# Patient Record
Sex: Female | Born: 2000 | Race: Black or African American | Hispanic: No | Marital: Single | Smoking: Never smoker
Health system: Southern US, Community
[De-identification: ages and names within clinical notes are randomized; demographics above are authoritative.]

## PROBLEM LIST (undated history)

## (undated) DIAGNOSIS — L309 Dermatitis, unspecified: Secondary | ICD-10-CM

## (undated) DIAGNOSIS — J45909 Unspecified asthma, uncomplicated: Secondary | ICD-10-CM

## (undated) HISTORY — DX: Dermatitis, unspecified: L30.9

---
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
## 2018-07-12 ENCOUNTER — Telehealth: Payer: Self-pay | Admitting: *Deleted

## 2018-07-12 ENCOUNTER — Other Ambulatory Visit: Payer: Self-pay | Admitting: *Deleted

## 2018-07-12 MED ORDER — TRIAMCINOLONE ACETONIDE 0.1 % EX OINT: 1.0000 "application " | TOPICAL_OINTMENT | Freq: Two times a day (BID) | CUTANEOUS | 0 refills | Status: AC

## 2018-07-12 NOTE — Telephone Encounter (Signed)
Received call from patient.   Requested refill on Triamcinolone.   Advised that last OV noted in 2018. Advised no refills can be given without OV <1 year.   States that she has been caring for mother who had brain aneurysm and CVA. States that she will schedule appt, but would like to wait a little while D/T COVID 19.  Agreeable to terms. Prescription sent to pharmacy.

## 2019-04-29 ENCOUNTER — Other Ambulatory Visit: Payer: Self-pay

## 2019-04-29 ENCOUNTER — Encounter (HOSPITAL_COMMUNITY): Payer: Self-pay | Admitting: Emergency Medicine

## 2019-04-29 ENCOUNTER — Emergency Department (HOSPITAL_COMMUNITY)
Admission: EM | Admit: 2019-04-29 | Discharge: 2019-04-30 | Disposition: A | Discharge status: Home / Self Care | Payer: Medicaid Other | Attending: Emergency Medicine | Admitting: Emergency Medicine

## 2019-04-29 DIAGNOSIS — Z5321 Procedure and treatment not carried out due to patient leaving prior to being seen by health care provider: Secondary | ICD-10-CM | POA: Diagnosis not present

## 2019-04-29 DIAGNOSIS — R1012 Left upper quadrant pain: Secondary | ICD-10-CM | POA: Diagnosis present

## 2019-04-29 MED ORDER — SODIUM CHLORIDE 0.9% FLUSH: 3.0000 mL | Freq: Once | INTRAVENOUS | Status: DC

## 2019-04-29 NOTE — ED Triage Notes (Signed)
Patient reports LUQ pain onset today , denies emesis or diarrhea , no fever or chills .

## 2019-04-30 LAB — COMPREHENSIVE METABOLIC PANEL
ALT: 14 U/L (ref 0–44)
AST: 17 U/L (ref 15–41)
Albumin: 4.4 g/dL (ref 3.5–5.0)
Alkaline Phosphatase: 57 U/L (ref 38–126)
Anion gap: 9 (ref 5–15)
BUN: 12 mg/dL (ref 6–20)
CO2: 24 mmol/L (ref 22–32)
Calcium: 9.6 mg/dL (ref 8.9–10.3)
Chloride: 107 mmol/L (ref 98–111)
Creatinine, Ser: 0.98 mg/dL (ref 0.44–1.00)
GFR calc Af Amer: >60 mL/min (ref >60)
GFR calc non Af Amer: >60 mL/min (ref >60)
Glucose, Bld: 84 mg/dL (ref 70–99)
Potassium: 3.9 mmol/L (ref 3.5–5.1)
Sodium: 140 mmol/L (ref 135–145)
Total Bilirubin: 0.1 mg/dL — ABNORMAL LOW (ref 0.3–1.2)
Total Protein: 7.4 g/dL (ref 6.5–8.1)

## 2019-04-30 LAB — CBC
HCT: 42 % (ref 36.0–46.0)
Hemoglobin: 13.7 g/dL (ref 12.0–15.0)
MCH: 30.2 pg (ref 26.0–34.0)
MCHC: 32.6 g/dL (ref 30.0–36.0)
MCV: 92.7 fL (ref 80.0–100.0)
Platelets: 339 10*3/uL (ref 150–400)
RBC: 4.53 MIL/uL (ref 3.87–5.11)
RDW: 12.2 % (ref 11.5–15.5)
WBC: 6.8 10*3/uL (ref 4.0–10.5)
nRBC: 0 % (ref 0.0–0.2)

## 2019-04-30 LAB — URINALYSIS, ROUTINE W REFLEX MICROSCOPIC
Bilirubin Urine: NEGATIVE
Glucose, UA: NEGATIVE mg/dL
Hgb urine dipstick: NEGATIVE
Ketones, ur: 5 mg/dL — AB
Leukocytes,Ua: NEGATIVE
Nitrite: NEGATIVE
Protein, ur: NEGATIVE mg/dL
Specific Gravity, Urine: 1.024 (ref 1.005–1.030)
pH: 6 (ref 5.0–8.0)

## 2019-04-30 LAB — I-STAT BETA HCG BLOOD, ED (MC, WL, AP ONLY): I-stat hCG, quantitative: <5 m[IU]/mL (ref <5)

## 2019-04-30 LAB — LIPASE, BLOOD: Lipase: 19 U/L (ref 11–51)

## 2019-04-30 NOTE — ED Notes (Signed)
Pt stated she does not want to wait because of how people in the waiting room are acting.

## 2019-09-12 ENCOUNTER — Ambulatory Visit: Payer: Self-pay | Admitting: Nurse Practitioner

## 2019-09-14 ENCOUNTER — Ambulatory Visit: Payer: Self-pay | Admitting: Nurse Practitioner

## 2019-11-27 ENCOUNTER — Ambulatory Visit (HOSPITAL_COMMUNITY)
Admission: EM | Admit: 2019-11-27 | Discharge: 2019-11-27 | Disposition: A | Discharge status: Home / Self Care | Payer: Medicaid Other | Attending: Physician Assistant | Admitting: Physician Assistant

## 2019-11-27 ENCOUNTER — Encounter (HOSPITAL_COMMUNITY): Payer: Self-pay

## 2019-11-27 ENCOUNTER — Other Ambulatory Visit: Payer: Self-pay

## 2019-11-27 DIAGNOSIS — L2082 Flexural eczema: Secondary | ICD-10-CM

## 2019-11-27 LAB — POC URINE PREG, ED: Preg Test, Ur: NEGATIVE

## 2019-11-27 MED ORDER — PREDNISONE 10 MG PO TABS: 40.0000 mg | ORAL_TABLET | Freq: Every day | ORAL | 0 refills | Status: AC

## 2019-11-27 MED ORDER — TRIAMCINOLONE 0.1 % CREAM:EUCERIN CREAM 1:1: 1.0000 "application " | TOPICAL_CREAM | Freq: Three times a day (TID) | CUTANEOUS | 0 refills | Status: AC | PRN

## 2019-11-27 MED ORDER — CETIRIZINE HCL 10 MG PO TABS: 10.0000 mg | ORAL_TABLET | Freq: Every day | ORAL | 0 refills | Status: AC

## 2019-11-27 MED ORDER — ALBUTEROL SULFATE HFA 108 (90 BASE) MCG/ACT IN AERS: 1.0000 | INHALATION_SPRAY | Freq: Four times a day (QID) | RESPIRATORY_TRACT | 0 refills | Status: AC | PRN

## 2019-11-27 MED ORDER — CLOBETASOL PROPIONATE 0.05 % EX OINT: 1.0000 "application " | TOPICAL_OINTMENT | Freq: Two times a day (BID) | CUTANEOUS | 0 refills | Status: AC

## 2019-11-27 NOTE — ED Triage Notes (Signed)
Pt has recurrent flair of eczema started 2 wks ago. Pt states it's draining clear fluids and it's open. Pt has it on inside of thighs and AC areas bilat. Pt states when she walks it's painful due to skin rubbing with eczema.

## 2019-11-27 NOTE — ED Provider Notes (Signed)
[facility]    CSN: [id number] Arrival date & time: 11/27/19  1248      History   Chief Complaint Chief Complaint  Patient presents with  . Eczema    HPI Emma Kerr is a 19 y.o. female.   Patient with history of eczema presents for 2-week history of eczema flare.  She reports she has a large eczema flare on both inner thighs has been causing a lot of irritation and pain due to friction.  She also reports flares on both inner elbows.  She reports occasionally she will get flares behind her knees but this is not actively flaring.  She was previously followed by her primary care however she has not seen this provider in a while as she moved to the area and recently moved back.  She has been using Eucerin to manage her eczema on her own but this has not helped this most recent flare.  Patient is also requesting refill on her albuterol inhaler.  Denies shortness of breath or wheezing.  She reports she is at night.  Has not had to use it recently.     Past Medical History:  Diagnosis Date  . Asthma   . Eczema     Patient Active Problem List   Diagnosis Date Noted  . Contraceptive management 04/16/2016  . Left knee pain 08/24/2013  . Idiopathic scoliosis 08/24/2013  . Decreased visual acuity 12/14/2012  . Eczema 12/14/2012  . Asthma, mild intermittent 12/14/2012  . Back pain 12/14/2012    History reviewed. No pertinent surgical history.  OB History   No obstetric history on file.      Home Medications    Prior to Admission medications   Medication Sig Start Date End Date Taking? Authorizing Provider  albuterol (PROAIR HFA) 108 (90 Base) MCG/ACT inhaler Inhale 1-2 puffs into the lungs every 6 (six) hours as needed for wheezing or shortness of breath. INHALE 2 PUFFS BY MOUTH EVERY 4 HOURS AS NEEDED FOR SHORTNESS OF BREATH 11/27/19   Hellena Pridgen, Veryl Speak, PA-C  cetirizine (ZYRTEC ALLERGY) 10 MG tablet Take 1 tablet (10 mg total) by mouth daily. 11/27/19   Cyan Clippinger, Veryl Speak, PA-C  clobetasol ointment (TEMOVATE) 0.05 % Apply 1 application topically 2 (two) times daily for 7 days. 11/27/19 12/04/19  Staria Birkhead, Veryl Speak, PA-C  fluticasone (FLOVENT HFA) 44 MCG/ACT inhaler INHALE 1 PUFF INTO THE LUNGS TWICE DAILY 07/02/16   Salley Scarlet, MD  ibuprofen (ADVIL,MOTRIN) 600 MG tablet Take 1 tablet (600 mg total) by mouth every 8 (eight) hours as needed. 04/16/16   Salley Scarlet, MD  loratadine (CLARITIN) 10 MG tablet Take 1 tablet (10 mg total) by mouth daily. 04/16/16   Esmeralda, Velna Hatchet, MD  predniSONE (DELTASONE) 10 MG tablet Take 4 tablets (40 mg total) by mouth daily for 7 days. 11/27/19 12/04/19  Deshara Rossi, Veryl Speak, PA-C  Triamcinolone Acetonide (TRIAMCINOLONE 0.1 % CREAM : EUCERIN) CREA Apply 1 application topically 3 (three) times daily as needed. 11/27/19   Kairon Shock, Veryl Speak, PA-C  triamcinolone ointment (KENALOG) 0.1 % Apply 1 application topically 2 (two) times daily. 07/12/18   Salley Scarlet, MD    Family History No family history on file.  Social History Social History   Tobacco Use  . Smoking status: Never Smoker  . Smokeless tobacco: Never Used  Substance Use Topics  . Alcohol use: No    Alcohol/week: 0.0 standard drinks  . Drug use: Yes  Types: Marijuana     Allergies   Patient has no known allergies.   Review of Systems Review of Systems   Physical Exam Triage Vital Signs ED Triage Vitals  Enc Vitals Group     BP 11/27/19 1314 (!) 121/51     Pulse Rate 11/27/19 1314 69     Resp 11/27/19 1314 16     Temp 11/27/19 1314 98.2 F (36.8 C)     Temp Source 11/27/19 1314 Oral     SpO2 11/27/19 1314 100 %     Weight 11/27/19 1315 130 lb (59 kg)     Height 11/27/19 1315 5\' 1"  (1.549 m)     Head Circumference --      Peak Flow --      Pain Score 11/27/19 1315 8     Pain Loc --      Pain Edu? --      Excl. in GC? --    No data found.  Updated Vital Signs BP (!) 121/51   Pulse 69   Temp 98.2 F (36.8 C) (Oral)   Resp 16   Ht 5\' 1"  (1.549  m)   Wt 130 lb (59 kg)   SpO2 100%   BMI 24.56 kg/m   Visual Acuity Right Eye Distance:   Left Eye Distance:   Bilateral Distance:    Right Eye Near:   Left Eye Near:    Bilateral Near:     Physical Exam Vitals and nursing note reviewed.  Constitutional:      General: She is not in acute distress.    Appearance: She is well-developed.  HENT:     Head: Normocephalic and atraumatic.  Cardiovascular:     Rate and Rhythm: Normal rate.  Pulmonary:     Effort: Pulmonary effort is normal. No respiratory distress.  Musculoskeletal:     Cervical back: Neck supple.  Skin:    General: Skin is warm and dry.     Comments: Large swath of eczematous skin on the inner thighs.  Scant clear discharge.  No significant erythema or purulent discharge.  Flexural eczema patches on the in the antecubitals bilaterally.  Fine papular eczematous lesions on the face.  No eye involvement  Pictured below are AC and inner thigh lesions  Neurological:     Mental Status: She is alert.                UC Treatments / Results  Labs (all labs ordered are listed, but only abnormal results are displayed) Labs Reviewed  POC URINE PREG, ED    EKG   Radiology No results found.  Procedures Procedures (including critical care time)  Medications Ordered in UC Medications - No data to display  Initial Impression / Assessment and Plan / UC Course  I have reviewed the triage vital signs and the nursing notes.  Pertinent labs & imaging results that were available during my care of the patient were reviewed by me and considered in my medical decision making (see chart for details).     Flexural eczema Patient is a 19 year old with history of eczema presenting with moderate to severe eczema flare.  Does not appear infected.  Will place on prednisone.  Short course of clobetasol, to be followed with triamcinolone Eucerin combination.  Zyrtec for itch.  Referral for dermatology sent.   Instructed patient to call family medicine center tomorrow to take the soonest available appointment to establish care with a primary care and discuss dermatology referral.  Return and follow-up precautions discussed.  Patient verbalized understanding Final Clinical Impressions(s) / UC Diagnoses   Final diagnoses:  Flexural eczema     Discharge Instructions     Take prednisone as prescribed Apply clobetasol ointment sparingly across areas, 2 times a day for 1 week  - then begin using combination lotion/triamcinolone  Take zyrtec daily  Apply aquaphor as you have - you may also use vaseline  Avoid skin to skin fricition  Call family medicine center tomorrow for follow up ASAP  Call the derm number tomorrow to verify referral      ED Prescriptions    Medication Sig Dispense Auth. Provider   predniSONE (DELTASONE) 10 MG tablet Take 4 tablets (40 mg total) by mouth daily for 7 days. 28 tablet Yaroslav Gombos, Veryl Speak, PA-C   clobetasol ointment (TEMOVATE) 0.05 % Apply 1 application topically 2 (two) times daily for 7 days. 30 g Makinze Jani, Veryl Speak, PA-C   Triamcinolone Acetonide (TRIAMCINOLONE 0.1 % CREAM : EUCERIN) CREA Apply 1 application topically 3 (three) times daily as needed. 1 each Omer Puccinelli, Veryl Speak, PA-C   cetirizine (ZYRTEC ALLERGY) 10 MG tablet Take 1 tablet (10 mg total) by mouth daily. 30 tablet Kyleigha Markert, Veryl Speak, PA-C   albuterol (PROAIR HFA) 108 (90 Base) MCG/ACT inhaler Inhale 1-2 puffs into the lungs every 6 (six) hours as needed for wheezing or shortness of breath. INHALE 2 PUFFS BY MOUTH EVERY 4 HOURS AS NEEDED FOR SHORTNESS OF BREATH 18 g Dionisia Pacholski, Veryl Speak, PA-C     PDMP not reviewed this encounter.   Hermelinda Medicus, PA-C 11/27/19 1423

## 2019-11-27 NOTE — Discharge Instructions (Signed)
Take prednisone as prescribed Apply clobetasol ointment sparingly across areas, 2 times a day for 1 week  - then begin using combination lotion/triamcinolone  Take zyrtec daily  Apply aquaphor as you have - you may also use vaseline  Avoid skin to skin fricition  Call family medicine center tomorrow for follow up ASAP  Call the derm number tomorrow to verify referral

## 2020-08-05 ENCOUNTER — Ambulatory Visit (HOSPITAL_COMMUNITY)
Admission: EM | Admit: 2020-08-05 | Discharge: 2020-08-05 | Disposition: A | Discharge status: Home / Self Care | Payer: Medicaid Other | Attending: Family Medicine | Admitting: Family Medicine

## 2020-08-05 ENCOUNTER — Encounter (HOSPITAL_COMMUNITY): Payer: Self-pay | Admitting: Emergency Medicine

## 2020-08-05 DIAGNOSIS — N6310 Unspecified lump in the right breast, unspecified quadrant: Secondary | ICD-10-CM

## 2020-08-05 DIAGNOSIS — M778 Other enthesopathies, not elsewhere classified: Secondary | ICD-10-CM

## 2020-08-05 MED ORDER — PREDNISONE 20 MG PO TABS: 40.0000 mg | ORAL_TABLET | Freq: Every day | ORAL | 0 refills | Status: AC

## 2020-08-05 MED ORDER — CYCLOBENZAPRINE HCL 10 MG PO TABS: 5.0000 mg | ORAL_TABLET | Freq: Three times a day (TID) | ORAL | 0 refills | Status: AC | PRN

## 2020-08-05 NOTE — ED Triage Notes (Signed)
Pt presents today with left elbow pain x 1 week; denies injury. She also c/o lump to right upper breast that she is concerned about.

## 2020-08-05 NOTE — Discharge Instructions (Addendum)
May try Kinesiotape to help with your elbow tendinitis

## 2020-08-05 NOTE — ED Provider Notes (Signed)
[facility]    CSN: [id number] Arrival date & time: 08/05/20  1113      History   Chief Complaint Chief Complaint  Patient presents with  . Elbow Pain    HPI Emma Kerr is a 20 y.o. female.   Today with 1 week history of left elbow pain.  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  The elbow moves without difficulty but is very painful to do so and she is experiencing some weakness to the left hand because of the pain.  Has not tried anything over-the-counter for symptoms thus far. She is also having an intermittent right breast lump that seems to come on during stressful times.  This is the second time she has had this lump.  It is not painful, red, draining.  No injury to the area.  Does not seem to be related to her menstrual cycles.  No nipple discharge.     Past Medical History:  Diagnosis Date  . Asthma   . Eczema     Patient Active Problem List   Diagnosis Date Noted  . Contraceptive management 04/16/2016  . Left knee pain 08/24/2013  . Idiopathic scoliosis 08/24/2013  . Decreased visual acuity 12/14/2012  . Eczema 12/14/2012  . Asthma, mild intermittent 12/14/2012  . Back pain 12/14/2012    History reviewed. No pertinent surgical history.  OB History   No obstetric history on file.      Home Medications    Prior to Admission medications   Medication Sig Start Date End Date Taking? Authorizing Provider  cyclobenzaprine (FLEXERIL) 10 MG tablet Take 0.5-1 tablets (5-10 mg total) by mouth 3 (three) times daily as needed for muscle spasms. Do not drink alcohol or drive while taking this medication 08/05/20  Yes Particia Nearing, PA-C  predniSONE (DELTASONE) 20 MG tablet Take 2 tablets (40 mg total) by mouth daily with breakfast. 08/05/20  Yes Particia Nearing, PA-C  albuterol [facility] HFA) 108 (90 Base) MCG/ACT inhaler Inhale 1-2 puffs into the lungs every 6 (six) hours as needed for  wheezing or shortness of breath. INHALE 2 PUFFS BY MOUTH EVERY 4 HOURS AS NEEDED FOR SHORTNESS OF BREATH 11/27/19   Darr, Gerilyn Pilgrim, PA-C  cetirizine (ZYRTEC ALLERGY) 10 MG tablet Take 1 tablet (10 mg total) by mouth daily. 11/27/19   Darr, Gerilyn Pilgrim, PA-C  fluticasone (FLOVENT HFA) 44 MCG/ACT inhaler INHALE 1 PUFF INTO THE LUNGS TWICE DAILY 07/02/16   Salley Scarlet, MD  ibuprofen (ADVIL,MOTRIN) 600 MG tablet Take 1 tablet (600 mg total) by mouth every 8 (eight) hours as needed. 04/16/16   Salley Scarlet, MD  loratadine (CLARITIN) 10 MG tablet Take 1 tablet (10 mg total) by mouth daily. 04/16/16   [place], Velna Hatchet, MD  Triamcinolone Acetonide (TRIAMCINOLONE 0.1 % CREAM : EUCERIN) CREA Apply 1 application topically 3 (three) times daily as needed. 11/27/19   Darr, Gerilyn Pilgrim, PA-C  triamcinolone ointment (KENALOG) 0.1 % Apply 1 application topically 2 (two) times daily. 07/12/18   Salley Scarlet, MD    Family History Family History  Problem Relation Age of Onset  . Stroke Mother   . Healthy Father     Social History Social History   Tobacco Use  . Smoking status: Never Smoker  . Smokeless tobacco: Never Used  Vaping Use  . Vaping Use: Never used  Substance Use Topics  . Alcohol use: No    Alcohol/week: 0.0  standard drinks  . Drug use: Yes    Types: Marijuana     Allergies   Patient has no known allergies.   Review of Systems Review of Systems Per HPI  Physical Exam Triage Vital Signs ED Triage Vitals  Enc Vitals Group     BP 08/05/20 1204 91/64     Pulse Rate 08/05/20 1204 74     Resp 08/05/20 1204 16     Temp 08/05/20 1204 98.4 F (36.9 C)     Temp Source 08/05/20 1204 Oral     SpO2 08/05/20 1204 100 %     Weight 08/05/20 1203 152 lb (68.9 kg)     Height 08/05/20 1203 5\' 1"  (1.549 m)     Head Circumference --      Peak Flow --      Pain Score 08/05/20 1202 8     Pain Loc --      Pain Edu? --      Excl. in GC? --    No data found.  Updated Vital Signs BP 91/64 (BP  Location: Right Arm)   Pulse 74   Temp 98.4 F (36.9 C) (Oral)   Resp 16   Ht 5\' 1"  (1.549 m)   Wt 152 lb (68.9 kg)   LMP 07/11/2020 (Approximate)   SpO2 100%   BMI 28.72 kg/m   Visual Acuity Right Eye Distance:   Left Eye Distance:   Bilateral Distance:    Right Eye Near:   Left Eye Near:    Bilateral Near:     Physical Exam Vitals and nursing note reviewed.  Constitutional:      Appearance: Normal appearance. She is not ill-appearing.  HENT:     Head: Atraumatic.  Eyes:     Extraocular Movements: Extraocular movements intact.     Conjunctiva/sclera: Conjunctivae normal.  Cardiovascular:     Rate and Rhythm: Normal rate and regular rhythm.     Heart sounds: Normal heart sounds.  Pulmonary:     Effort: Pulmonary effort is normal.     Breath sounds: Normal breath sounds.  Chest:  Breasts:     Right: No nipple discharge, skin change, tenderness or axillary adenopathy.     Left: No axillary adenopathy.     Musculoskeletal:        General: Swelling and tenderness present. Normal range of motion.     Cervical back: Normal range of motion and neck supple.     Comments: Left elbow diffusely mildly edematous and tender to palpation Grip strength reduced left hand  Lymphadenopathy:     Upper Body:     Right upper body: No axillary adenopathy.     Left upper body: No axillary adenopathy.  Skin:    General: Skin is warm and dry.     Findings: No erythema.  Neurological:     Mental Status: She is alert and oriented to person, place, and time.     Sensory: No sensory deficit.     Comments: Left upper extremity neurovascularly intact  Psychiatric:        Mood and Affect: Mood normal.        Thought Content: Thought content normal.        Judgment: Judgment normal.      UC Treatments / Results  Labs (all labs ordered are listed, but only abnormal results are displayed) Labs Reviewed - No data to display  EKG   Radiology No results  found.  Procedures Procedures (including critical care  time)  Medications Ordered in UC Medications - No data to display  Initial Impression / Assessment and Plan / UC Course  I have reviewed the triage vital signs and the nursing notes.  Pertinent labs & imaging results that were available during my care of the patient were reviewed by me and considered in my medical decision making (see chart for details).     Consistent with left elbow tendinitis, likely from her repetitive nature of her work.  Will treat with prednisone, Flexeril, Kinesiotape, supportive over-the-counter care.  Work note given for rest.  Follow-up with gynecology for further monitoring of her intermittent right breast lump.  Does not appear in need of emergent imaging at this time and is episodic in nature.  OB/GYN information given for close follow-up.  Final Clinical Impressions(s) / UC Diagnoses   Final diagnoses:  Left elbow tendonitis  Lump of right breast     Discharge Instructions     May try Kinesiotape to help with your elbow tendinitis    ED Prescriptions    Medication Sig Dispense Auth. Provider   predniSONE (DELTASONE) 20 MG tablet Take 2 tablets (40 mg total) by mouth daily with breakfast. 10 tablet Particia Nearing, PA-C   cyclobenzaprine (FLEXERIL) 10 MG tablet Take 0.5-1 tablets (5-10 mg total) by mouth 3 (three) times daily as needed for muscle spasms. Do not drink alcohol or drive while taking this medication 15 tablet Particia Nearing, [place]     PDMP not reviewed this encounter.   Particia Nearing, [place] 08/05/20 1437

## 2020-08-11 ENCOUNTER — Emergency Department (HOSPITAL_COMMUNITY)
Admission: EM | Admit: 2020-08-11 | Discharge: 2020-08-11 | Disposition: A | Discharge status: Home / Self Care | Payer: Medicaid Other | Attending: Emergency Medicine | Admitting: Emergency Medicine

## 2020-08-11 ENCOUNTER — Other Ambulatory Visit: Payer: Self-pay

## 2020-08-11 DIAGNOSIS — M25532 Pain in left wrist: Secondary | ICD-10-CM | POA: Insufficient documentation

## 2020-08-11 DIAGNOSIS — J45909 Unspecified asthma, uncomplicated: Secondary | ICD-10-CM | POA: Insufficient documentation

## 2020-08-11 DIAGNOSIS — M542 Cervicalgia: Secondary | ICD-10-CM | POA: Diagnosis not present

## 2020-08-11 DIAGNOSIS — M25562 Pain in left knee: Secondary | ICD-10-CM | POA: Insufficient documentation

## 2020-08-11 DIAGNOSIS — Z7952 Long term (current) use of systemic steroids: Secondary | ICD-10-CM | POA: Insufficient documentation

## 2020-08-11 DIAGNOSIS — M546 Pain in thoracic spine: Secondary | ICD-10-CM | POA: Diagnosis not present

## 2020-08-11 DIAGNOSIS — Y92410 Unspecified street and highway as the place of occurrence of the external cause: Secondary | ICD-10-CM | POA: Diagnosis not present

## 2020-08-11 MED ORDER — METHOCARBAMOL 500 MG PO TABS
500.0000 mg | ORAL_TABLET | Freq: Once | ORAL | Status: AC
  Administered 2020-08-11: 500 mg via ORAL
  Filled 2020-08-11: qty 1
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
